# Patient Record
Sex: Male | Born: 1985 | Race: Black or African American | Hispanic: No | Marital: Single | Smoking: Current every day smoker
Health system: Southern US, Community
[De-identification: ages and names within clinical notes are randomized; demographics above are authoritative.]

## PROBLEM LIST (undated history)

## (undated) DIAGNOSIS — J45909 Unspecified asthma, uncomplicated: Secondary | ICD-10-CM

## (undated) DIAGNOSIS — M79A29 Nontraumatic compartment syndrome of unspecified lower extremity: Secondary | ICD-10-CM

## (undated) DIAGNOSIS — M1990 Unspecified osteoarthritis, unspecified site: Secondary | ICD-10-CM

## (undated) HISTORY — PX: OTHER SURGICAL HISTORY: SHX169

---

## 2006-04-19 ENCOUNTER — Emergency Department: Payer: Self-pay

## 2006-12-31 ENCOUNTER — Emergency Department: Payer: Self-pay | Admitting: Emergency Medicine

## 2007-01-01 ENCOUNTER — Ambulatory Visit: Payer: Self-pay | Admitting: Emergency Medicine

## 2007-01-16 ENCOUNTER — Emergency Department: Payer: Self-pay | Admitting: Emergency Medicine

## 2007-09-03 ENCOUNTER — Emergency Department: Payer: Self-pay | Admitting: Emergency Medicine

## 2007-09-17 ENCOUNTER — Emergency Department: Payer: Self-pay | Admitting: Emergency Medicine

## 2010-07-06 ENCOUNTER — Emergency Department: Payer: Self-pay | Admitting: Emergency Medicine

## 2010-12-23 ENCOUNTER — Emergency Department: Payer: Self-pay | Admitting: Internal Medicine

## 2016-11-17 ENCOUNTER — Other Ambulatory Visit: Payer: Self-pay

## 2016-11-17 ENCOUNTER — Emergency Department
Admission: EM | Admit: 2016-11-17 | Discharge: 2016-11-17 | Disposition: A | Discharge status: Home / Self Care | Payer: Medicare Other | Attending: Emergency Medicine | Admitting: Emergency Medicine

## 2016-11-17 ENCOUNTER — Encounter: Payer: Self-pay | Admitting: Emergency Medicine

## 2016-11-17 ENCOUNTER — Emergency Department: Payer: Medicare Other

## 2016-11-17 DIAGNOSIS — F17210 Nicotine dependence, cigarettes, uncomplicated: Secondary | ICD-10-CM | POA: Diagnosis not present

## 2016-11-17 DIAGNOSIS — Y929 Unspecified place or not applicable: Secondary | ICD-10-CM | POA: Insufficient documentation

## 2016-11-17 DIAGNOSIS — S6721XA Crushing injury of right hand, initial encounter: Secondary | ICD-10-CM | POA: Diagnosis not present

## 2016-11-17 DIAGNOSIS — S59911A Unspecified injury of right forearm, initial encounter: Secondary | ICD-10-CM | POA: Diagnosis present

## 2016-11-17 DIAGNOSIS — S5781XA Crushing injury of right forearm, initial encounter: Secondary | ICD-10-CM | POA: Diagnosis not present

## 2016-11-17 DIAGNOSIS — W228XXA Striking against or struck by other objects, initial encounter: Secondary | ICD-10-CM | POA: Diagnosis not present

## 2016-11-17 DIAGNOSIS — Y999 Unspecified external cause status: Secondary | ICD-10-CM | POA: Diagnosis not present

## 2016-11-17 DIAGNOSIS — Y9389 Activity, other specified: Secondary | ICD-10-CM | POA: Diagnosis not present

## 2016-11-17 HISTORY — DX: Unspecified osteoarthritis, unspecified site: M19.90

## 2016-11-17 MED ORDER — NAPROXEN 500 MG PO TABS: 500.0000 mg | ORAL_TABLET | Freq: Two times a day (BID) | ORAL | 0 refills | Status: DC

## 2016-11-17 MED ORDER — HYDROCODONE-ACETAMINOPHEN 5-325 MG PO TABS: 1.0000 | ORAL_TABLET | ORAL | 0 refills | Status: DC | PRN
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
## 2016-11-17 NOTE — Discharge Instructions (Signed)
Ice and elevation as needed for pain and swelling. Follow-up with the orthopedic Department at [facility] if any continued problems. You may also follow-up with [facility]. Take Norco one every 6 hours as needed for moderate to severe pain. Begin taking naproxen 500 mg twice a day with food.  Today you are given a work note for 2 days only. You will need  to follow-up with either an urgent care or doctor of your choice if continued days off as needed.
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
## 2016-11-17 NOTE — ED Notes (Signed)
Pt relayed to this nurse, "why yall not trying to do anything for my pain". "How is antibiotics going to help my pain?" . Informed provider of pain control issue.

## 2016-12-20 ENCOUNTER — Encounter (HOSPITAL_COMMUNITY): Payer: Self-pay

## 2016-12-20 ENCOUNTER — Other Ambulatory Visit: Payer: Self-pay

## 2016-12-20 ENCOUNTER — Emergency Department (HOSPITAL_COMMUNITY)
Admission: EM | Admit: 2016-12-20 | Discharge: 2016-12-20 | Disposition: A | Discharge status: Home / Self Care | Payer: Medicare Other | Attending: Emergency Medicine | Admitting: Emergency Medicine

## 2016-12-20 DIAGNOSIS — F17210 Nicotine dependence, cigarettes, uncomplicated: Secondary | ICD-10-CM | POA: Insufficient documentation

## 2016-12-20 DIAGNOSIS — M79601 Pain in right arm: Secondary | ICD-10-CM | POA: Insufficient documentation

## 2016-12-20 MED ORDER — NAPROXEN 500 MG PO TABS: 500.0000 mg | ORAL_TABLET | Freq: Two times a day (BID) | ORAL | 0 refills | Status: DC

## 2016-12-20 MED ORDER — ALBUTEROL SULFATE HFA 108 (90 BASE) MCG/ACT IN AERS: 1.0000 | INHALATION_SPRAY | Freq: Four times a day (QID) | RESPIRATORY_TRACT | 3 refills | Status: DC | PRN
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
## 2016-12-20 NOTE — Discharge Instructions (Signed)
Follow-up with orthopedist listed below for further duration. Take naproxen as needed for pain. Return to ED for worsening pain, additional injuries, loss of sensation in arm, color or temperature change of joints.

## 2016-12-25 ENCOUNTER — Emergency Department: Payer: Medicare Other

## 2016-12-25 ENCOUNTER — Emergency Department
Admission: EM | Admit: 2016-12-25 | Discharge: 2016-12-25 | Disposition: A | Discharge status: Home / Self Care | Payer: Medicare Other | Attending: Emergency Medicine | Admitting: Emergency Medicine

## 2016-12-25 ENCOUNTER — Other Ambulatory Visit: Payer: Self-pay

## 2016-12-25 DIAGNOSIS — Z79899 Other long term (current) drug therapy: Secondary | ICD-10-CM | POA: Diagnosis not present

## 2016-12-25 DIAGNOSIS — Y9389 Activity, other specified: Secondary | ICD-10-CM | POA: Insufficient documentation

## 2016-12-25 DIAGNOSIS — Y929 Unspecified place or not applicable: Secondary | ICD-10-CM | POA: Insufficient documentation

## 2016-12-25 DIAGNOSIS — Y998 Other external cause status: Secondary | ICD-10-CM | POA: Insufficient documentation

## 2016-12-25 DIAGNOSIS — S59911A Unspecified injury of right forearm, initial encounter: Secondary | ICD-10-CM | POA: Diagnosis present

## 2016-12-25 DIAGNOSIS — S52121A Displaced fracture of head of right radius, initial encounter for closed fracture: Secondary | ICD-10-CM | POA: Diagnosis not present

## 2016-12-25 DIAGNOSIS — W01198A Fall on same level from slipping, tripping and stumbling with subsequent striking against other object, initial encounter: Secondary | ICD-10-CM | POA: Diagnosis not present

## 2016-12-25 DIAGNOSIS — F17210 Nicotine dependence, cigarettes, uncomplicated: Secondary | ICD-10-CM | POA: Insufficient documentation

## 2016-12-25 MED ORDER — NAPROXEN 500 MG PO TABS: 500.0000 mg | ORAL_TABLET | Freq: Two times a day (BID) | ORAL | 0 refills | Status: DC

## 2016-12-25 MED ORDER — HYDROCODONE-ACETAMINOPHEN 5-325 MG PO TABS: 1.0000 | ORAL_TABLET | Freq: Four times a day (QID) | ORAL | 0 refills | Status: DC | PRN

## 2016-12-25 NOTE — ED Triage Notes (Signed)
Pt slipped and fell into snow today. C/o right arm pain since. Hx of right arm injury and nerve injury to that right arm.

## 2016-12-25 NOTE — Discharge Instructions (Signed)
Wear sling for support and protection.  Ice and elevate to decrease swelling. Norco every 6 hours prn pain.  Begin taking naproxen twice a day with food for inflammation. Call Dr. Odis LusterBowers for an appointment.

## 2016-12-25 NOTE — ED Provider Notes (Signed)
[facility] Emergency Department Provider Note  ____________________________________________   First MD Initiated Contact with Patient 12/25/16 1207     (approximate)  I have reviewed the triage vital signs and the nursing notes.   HISTORY  Chief Complaint Fall   HPI Alric QuanLennard Mach is a 31 y.o. male is here complaining of right forearm pain after falling in the snow today. Patient states he has a history of injury to his arm in the past requiring surgery. Patient denies any head injury or loss of consciousness during his fall. Patient has not taken any over-the-counter medication prior to his arrival.he rates his pain as a 10 out of 10.   Past Medical History:  Diagnosis Date  . Arthritis     There are no active problems to display for this patient.   Past Surgical History:  Procedure Laterality Date  . arm surgery      Prior to Admission medications   Medication Sig Start Date End Date Taking? Authorizing Provider  albuterol (PROVENTIL HFA;VENTOLIN HFA) 108 (90 Base) MCG/ACT inhaler Inhale 1-2 puffs into the lungs every 6 (six) hours as needed for wheezing or shortness of breath. 12/20/16   Khatri, Hina, PA-C  gabapentin (NEURONTIN) 400 MG capsule Take 800 mg by mouth at bedtime.    [provider]  HYDROcodone-acetaminophen (NORCO) 5-325 MG tablet Take 1 tablet by mouth every 6 (six) hours as needed for moderate pain. 12/25/16   Tommi RumpsSummers, Rhonda L, PA-C  naproxen (NAPROSYN) 500 MG tablet Take 1 tablet (500 mg total) by mouth 2 (two) times daily with a meal. 12/25/16   Tommi RumpsSummers, Rhonda L, PA-C    Allergies Banana; Grapeseed extract [nutritional supplements]; and Penicillins  No family history on file.  Social History Social History   Tobacco Use  . Smoking status: Current Every Day Smoker    Types: Cigarettes  . Smokeless tobacco: Never Used  Substance Use Topics  . Alcohol use: No    Frequency: Never  . Drug use: No     Review of Systems Constitutional: No fever/chills Cardiovascular: Denies chest pain. Respiratory: Denies shortness of breath. Gastrointestinal:  No nausea, no vomiting.  Musculoskeletal: positive for right forearm pain. Skin: Negative for rash. Neurological: positive for chronic paresthesias secondary to injury to right arm. ____________________________________________   PHYSICAL EXAM:  VITAL SIGNS: ED Triage Vitals  Enc Vitals Group     BP 12/25/16 1113 (!) 141/98     Pulse Rate 12/25/16 1113 94     Resp 12/25/16 1113 18     Temp 12/25/16 1113 98.7 F (37.1 C)     Temp Source 12/25/16 1113 Oral     SpO2 12/25/16 1113 100 %     Weight 12/25/16 1114 187 lb (84.8 kg)     Height 12/25/16 1114 5\' 6"  (1.676 m)     Head Circumference --      Peak Flow --      Pain Score 12/25/16 1113 10     Pain Loc --      Pain Edu? --      Excl. in GC? --    Constitutional: Alert and oriented. Well appearing and in no acute distress. Eyes: Conjunctivae are normal.  Head: Atraumatic. Nose: no trauma. Neck: No stridor.  No cervical tenderness on palpation posteriorly. Range of motion is without restriction. Cardiovascular: Normal rate, regular rhythm. Grossly normal heart sounds.  Good peripheral circulation. Respiratory: Normal respiratory effort.  No retractions. Lungs CTAB. Gastrointestinal: Soft and nontender.  No distention.  No CVA tenderness. Musculoskeletal: moderate tenderness on palpation of the right forearm diffusely. Patient has a large scar from previous injury and surgery in the volar aspect. No abrasions or soft tissue swelling is seen. Patient is able to pull his sweat shirt off without assistance and place it back on. There is no tenderness on palpation of the right shoulder both anteriorly and posteriorly. Range of motion of the shoulder is without crepitus. Patient guards against movement of the right elbow. Neurologic:  Normal speech and language. No gross focal neurologic  deficits are appreciated. No gait instability. Skin:  Skin is warm, dry and intact. No abrasions or ecchymosis noted. Scar tissue as noted above. Psychiatric: Mood and affect are normal. Speech and behavior are normal.  ____________________________________________   LABS (all labs ordered are listed, but only abnormal results are displayed)  Labs Reviewed - No data to display   RADIOLOGY  Dg Forearm Right  Result Date: 12/25/2016 CLINICAL DATA:  Slipped and fell into snow today. Right arm pain. Nerve injury. EXAM: RIGHT FOREARM - 2 VIEW COMPARISON:  None. FINDINGS: A mildly displaced fracture is present at the radial head. A joint effusion is present. No other focal soft tissue injury is present. The wrist is intact. IMPRESSION: Mildly displaced right radial head fracture. Electronically Signed   By: Marin Robertshristopher  Mattern M.D.   On: 12/25/2016 13:02    ____________________________________________   PROCEDURES  Procedure(s) performed: None  Procedures  Critical Care performed: No  ____________________________________________   INITIAL IMPRESSION / ASSESSMENT AND PLAN / ED COURSE Patient was made aware that he does have a minimally displaced fracture of the radial head. He was placed in a sling. Patient was given a prescription for naproxen 500 mg twice a day with food and Norco one every 6 hours as needed for moderate pain. He is to ice and elevate as needed for pain or swelling. He was given information about Dr. Odis LusterBowers and contact him as he is the orthopedist on call.  ____________________________________________   FINAL CLINICAL IMPRESSION(S) / ED DIAGNOSES  Final diagnoses:  Closed displaced fracture of head of right radius, initial encounter     ED Discharge Orders        Ordered    HYDROcodone-acetaminophen (NORCO) 5-325 MG tablet  Every 6 hours PRN     12/25/16 1323    naproxen (NAPROSYN) 500 MG tablet  2 times daily with meals     12/25/16 1323        Note:  This document was prepared using Dragon voice recognition software and may include unintentional dictation errors.    Tommi RumpsSummers, Rhonda L, PA-C 12/25/16 1348    Jene EveryKinner, Robert, MD 12/25/16 [id number]
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
## 2017-01-05 ENCOUNTER — Other Ambulatory Visit: Payer: Self-pay | Admitting: Emergency Medicine

## 2018-09-02 ENCOUNTER — Emergency Department: Payer: Medicare (Managed Care)

## 2018-09-02 ENCOUNTER — Encounter: Payer: Self-pay | Admitting: Emergency Medicine

## 2018-09-02 ENCOUNTER — Emergency Department
Admission: EM | Admit: 2018-09-02 | Discharge: 2018-09-02 | Disposition: A | Discharge status: Home / Self Care | Payer: Medicare (Managed Care) | Attending: Emergency Medicine | Admitting: Emergency Medicine

## 2018-09-02 ENCOUNTER — Other Ambulatory Visit: Payer: Self-pay

## 2018-09-02 DIAGNOSIS — Y998 Other external cause status: Secondary | ICD-10-CM | POA: Diagnosis not present

## 2018-09-02 DIAGNOSIS — S59911A Unspecified injury of right forearm, initial encounter: Secondary | ICD-10-CM | POA: Diagnosis present

## 2018-09-02 DIAGNOSIS — W231XXA Caught, crushed, jammed, or pinched between stationary objects, initial encounter: Secondary | ICD-10-CM | POA: Diagnosis not present

## 2018-09-02 DIAGNOSIS — Y9389 Activity, other specified: Secondary | ICD-10-CM | POA: Insufficient documentation

## 2018-09-02 DIAGNOSIS — F17210 Nicotine dependence, cigarettes, uncomplicated: Secondary | ICD-10-CM | POA: Diagnosis not present

## 2018-09-02 DIAGNOSIS — Y929 Unspecified place or not applicable: Secondary | ICD-10-CM | POA: Diagnosis not present

## 2018-09-02 DIAGNOSIS — S5011XA Contusion of right forearm, initial encounter: Secondary | ICD-10-CM | POA: Insufficient documentation

## 2018-09-02 MED ORDER — HYDROCODONE-ACETAMINOPHEN 5-325 MG PO TABS
1.0000 | ORAL_TABLET | Freq: Once | ORAL | Status: AC
  Administered 2018-09-02: 1 via ORAL
  Filled 2018-09-02: qty 1

## 2018-09-02 MED ORDER — MELOXICAM 15 MG PO TABS: 15.0000 mg | ORAL_TABLET | Freq: Every day | ORAL | 0 refills | Status: DC

## 2018-09-02 MED ORDER — TRAMADOL HCL 50 MG PO TABS: 50.0000 mg | ORAL_TABLET | Freq: Four times a day (QID) | ORAL | 0 refills | Status: DC | PRN

## 2018-09-02 NOTE — ED Triage Notes (Signed)
PT arrives with complaints of pain to right arm due to mechanical injury.

## 2018-09-02 NOTE — ED Notes (Signed)
esign pad not working pt unable to sign. Pt verbalized understanding of d/c and follow up instructions
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
## 2018-11-30 ENCOUNTER — Emergency Department: Payer: Medicare (Managed Care)

## 2018-11-30 ENCOUNTER — Other Ambulatory Visit: Payer: Self-pay

## 2018-11-30 ENCOUNTER — Encounter: Payer: Self-pay | Admitting: Emergency Medicine

## 2018-11-30 ENCOUNTER — Emergency Department
Admission: EM | Admit: 2018-11-30 | Discharge: 2018-11-30 | Disposition: A | Discharge status: Home / Self Care | Payer: Medicare (Managed Care) | Attending: Emergency Medicine | Admitting: Emergency Medicine

## 2018-11-30 DIAGNOSIS — Y999 Unspecified external cause status: Secondary | ICD-10-CM | POA: Insufficient documentation

## 2018-11-30 DIAGNOSIS — Y939 Activity, unspecified: Secondary | ICD-10-CM | POA: Diagnosis not present

## 2018-11-30 DIAGNOSIS — S199XXA Unspecified injury of neck, initial encounter: Secondary | ICD-10-CM | POA: Diagnosis present

## 2018-11-30 DIAGNOSIS — F17210 Nicotine dependence, cigarettes, uncomplicated: Secondary | ICD-10-CM | POA: Insufficient documentation

## 2018-11-30 DIAGNOSIS — S161XXA Strain of muscle, fascia and tendon at neck level, initial encounter: Secondary | ICD-10-CM | POA: Diagnosis not present

## 2018-11-30 DIAGNOSIS — Y92410 Unspecified street and highway as the place of occurrence of the external cause: Secondary | ICD-10-CM | POA: Insufficient documentation

## 2018-11-30 DIAGNOSIS — S4991XA Unspecified injury of right shoulder and upper arm, initial encounter: Secondary | ICD-10-CM | POA: Diagnosis not present

## 2018-11-30 HISTORY — DX: Nontraumatic compartment syndrome of unspecified lower extremity: M79.A29

## 2018-11-30 MED ORDER — TRAMADOL HCL 50 MG PO TABS
50.0000 mg | ORAL_TABLET | Freq: Once | ORAL | Status: AC
  Administered 2018-11-30: 50 mg via ORAL
  Filled 2018-11-30: qty 1

## 2018-11-30 MED ORDER — OXYCODONE-ACETAMINOPHEN 5-325 MG PO TABS
1.0000 | ORAL_TABLET | Freq: Once | ORAL | Status: AC
  Administered 2018-11-30: 1 via ORAL
  Filled 2018-11-30: qty 1

## 2018-11-30 MED ORDER — OXYCODONE-ACETAMINOPHEN 5-325 MG PO TABS: 1.0000 | ORAL_TABLET | ORAL | 0 refills | Status: DC | PRN

## 2018-11-30 MED ORDER — BACLOFEN 10 MG PO TABS: 10.0000 mg | ORAL_TABLET | Freq: Every day | ORAL | 1 refills | Status: DC
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
## 2018-11-30 NOTE — ED Notes (Signed)
Pt otf for imaging 

## 2018-11-30 NOTE — Discharge Instructions (Signed)
Follow-up with your regular orthopedic, please call him for an appointment.  Use medications as prescribed.  Apply ice to areas that hurt.  Return emergency department worsening.
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
## 2018-12-23 ENCOUNTER — Encounter (HOSPITAL_COMMUNITY): Payer: Self-pay | Admitting: Emergency Medicine

## 2018-12-23 ENCOUNTER — Encounter: Payer: Self-pay | Admitting: Emergency Medicine

## 2018-12-23 ENCOUNTER — Emergency Department: Payer: Medicare (Managed Care)

## 2018-12-23 ENCOUNTER — Emergency Department (HOSPITAL_COMMUNITY)
Admission: EM | Admit: 2018-12-23 | Discharge: 2018-12-23 | Disposition: A | Discharge status: Home / Self Care | Payer: Medicare (Managed Care) | Attending: Emergency Medicine | Admitting: Emergency Medicine

## 2018-12-23 ENCOUNTER — Other Ambulatory Visit: Payer: Self-pay

## 2018-12-23 ENCOUNTER — Emergency Department
Admission: EM | Admit: 2018-12-23 | Discharge: 2018-12-23 | Disposition: A | Discharge status: Home / Self Care | Payer: Medicare (Managed Care) | Attending: Emergency Medicine | Admitting: Emergency Medicine

## 2018-12-23 DIAGNOSIS — S39011A Strain of muscle, fascia and tendon of abdomen, initial encounter: Secondary | ICD-10-CM | POA: Diagnosis not present

## 2018-12-23 DIAGNOSIS — Z5321 Procedure and treatment not carried out due to patient leaving prior to being seen by health care provider: Secondary | ICD-10-CM | POA: Diagnosis not present

## 2018-12-23 DIAGNOSIS — R1032 Left lower quadrant pain: Secondary | ICD-10-CM | POA: Diagnosis not present

## 2018-12-23 DIAGNOSIS — Y929 Unspecified place or not applicable: Secondary | ICD-10-CM | POA: Diagnosis not present

## 2018-12-23 DIAGNOSIS — X58XXXA Exposure to other specified factors, initial encounter: Secondary | ICD-10-CM | POA: Diagnosis not present

## 2018-12-23 DIAGNOSIS — Z79899 Other long term (current) drug therapy: Secondary | ICD-10-CM | POA: Diagnosis not present

## 2018-12-23 DIAGNOSIS — Y998 Other external cause status: Secondary | ICD-10-CM | POA: Diagnosis not present

## 2018-12-23 DIAGNOSIS — T148XXA Other injury of unspecified body region, initial encounter: Secondary | ICD-10-CM

## 2018-12-23 DIAGNOSIS — Y939 Activity, unspecified: Secondary | ICD-10-CM | POA: Diagnosis not present

## 2018-12-23 DIAGNOSIS — R112 Nausea with vomiting, unspecified: Secondary | ICD-10-CM | POA: Diagnosis not present

## 2018-12-23 DIAGNOSIS — F17210 Nicotine dependence, cigarettes, uncomplicated: Secondary | ICD-10-CM | POA: Insufficient documentation

## 2018-12-23 DIAGNOSIS — S3991XA Unspecified injury of abdomen, initial encounter: Secondary | ICD-10-CM | POA: Diagnosis present

## 2018-12-23 DIAGNOSIS — K625 Hemorrhage of anus and rectum: Secondary | ICD-10-CM | POA: Insufficient documentation

## 2018-12-23 LAB — CBC
HCT: 47.6 % (ref 39.0–52.0)
Hemoglobin: 16.2 g/dL (ref 13.0–17.0)
MCH: 33.3 pg (ref 26.0–34.0)
MCHC: 34 g/dL (ref 30.0–36.0)
MCV: 97.9 fL (ref 80.0–100.0)
Platelets: 296 10*3/uL (ref 150–400)
RBC: 4.86 MIL/uL (ref 4.22–5.81)
RDW: 13.1 % (ref 11.5–15.5)
WBC: 9 10*3/uL (ref 4.0–10.5)
nRBC: 0 % (ref 0.0–0.2)

## 2018-12-23 LAB — COMPREHENSIVE METABOLIC PANEL
ALT: 33 U/L (ref 0–44)
AST: 34 U/L (ref 15–41)
Albumin: 3.6 g/dL (ref 3.5–5.0)
Alkaline Phosphatase: 50 U/L (ref 38–126)
Anion gap: 9 (ref 5–15)
BUN: 16 mg/dL (ref 6–20)
CO2: 27 mmol/L (ref 22–32)
Calcium: 9 mg/dL (ref 8.9–10.3)
Chloride: 105 mmol/L (ref 98–111)
Creatinine, Ser: 1.08 mg/dL (ref 0.61–1.24)
GFR calc Af Amer: >60 mL/min (ref >60)
GFR calc non Af Amer: >60 mL/min (ref >60)
Glucose, Bld: 88 mg/dL (ref 70–99)
Potassium: 3.8 mmol/L (ref 3.5–5.1)
Sodium: 141 mmol/L (ref 135–145)
Total Bilirubin: 0.7 mg/dL (ref 0.3–1.2)
Total Protein: 6 g/dL — ABNORMAL LOW (ref 6.5–8.1)

## 2018-12-23 LAB — LIPASE, BLOOD: Lipase: 38 U/L (ref 11–51)

## 2018-12-23 MED ORDER — ONDANSETRON 4 MG PO TBDP
4.0000 mg | ORAL_TABLET | Freq: Once | ORAL | Status: AC
  Administered 2018-12-23: 4 mg via ORAL
  Filled 2018-12-23: qty 1

## 2018-12-23 MED ORDER — ALBUTEROL SULFATE HFA 108 (90 BASE) MCG/ACT IN AERS: 2.0000 | INHALATION_SPRAY | Freq: Four times a day (QID) | RESPIRATORY_TRACT | 1 refills | Status: DC | PRN

## 2018-12-23 MED ORDER — HYDROCODONE-ACETAMINOPHEN 5-325 MG PO TABS: 1.0000 | ORAL_TABLET | Freq: Four times a day (QID) | ORAL | 0 refills | Status: AC | PRN

## 2018-12-23 MED ORDER — DOCUSATE SODIUM 100 MG PO CAPS: 100.0000 mg | ORAL_CAPSULE | Freq: Two times a day (BID) | ORAL | 0 refills | Status: AC

## 2018-12-23 MED ORDER — SODIUM CHLORIDE 0.9% FLUSH: 3.0000 mL | Freq: Once | INTRAVENOUS | Status: DC

## 2018-12-23 MED ORDER — HYDROCODONE-ACETAMINOPHEN 5-325 MG PO TABS
2.0000 | ORAL_TABLET | Freq: Once | ORAL | Status: AC
  Administered 2018-12-23: 2 via ORAL
  Filled 2018-12-23: qty 2

## 2018-12-23 NOTE — ED Notes (Signed)
Pt appears in obvious pain while ambulating and reports being unable to get comfortable while sitting. Pain in LLQ and left flank since a sneeze last night. Bright red blood also noted in stool. Pt reports there is more blood the more he wipes but pt denies a known hx of hemorrhoids. No NVD and no fevers. No changes in urination.

## 2018-12-23 NOTE — ED Notes (Signed)
No reply for room x5. Not seen in lobby at this time.

## 2018-12-23 NOTE — ED Notes (Signed)
MD at bedside to update patient

## 2018-12-23 NOTE — ED Notes (Signed)
Pt has returned to treatment room form CT with no changes in symptoms and NAD noted.

## 2018-12-23 NOTE — ED Notes (Signed)
MD at bedside. 

## 2018-12-23 NOTE — ED Notes (Signed)
Patient transported to CT 

## 2018-12-23 NOTE — ED Provider Notes (Signed)
[facility] Emergency Department Provider Note  Time seen: 2:55 PM  I have reviewed the triage vital signs and the nursing notes.   HISTORY  Chief Complaint Abdominal Pain and Flank Pain   HPI Miguel Key is a 33 y.o. male with a past medical history of arthritis, presents to the emergency department for left flank pain.  According to the patient he sneezed last night and developed significant severe left mid abdominal pain.  States ever since he has had pain to this area space with any type of movement such as sitting up or twisting.  He also noticed this morning some blood after having a hard bowel movement.  Patient states moderate sharp pain to left mid abdomen.  Also states he is out of his albuterol inhaler and is requesting a refill.   Past Medical History:  Diagnosis Date  . Arthritis   . Chronic compartment syndrome of lower extremity    of upper extremity not lower pt per    There are no active problems to display for this patient.   Past Surgical History:  Procedure Laterality Date  . arm surgery      Prior to Admission medications   Medication Sig Start Date End Date Taking? Authorizing Provider  albuterol (PROVENTIL HFA;VENTOLIN HFA) 108 (90 Base) MCG/ACT inhaler Inhale 1-2 puffs into the lungs every 6 (six) hours as needed for wheezing or shortness of breath. 12/20/16   Khatri, Hina, PA-C  baclofen (LIORESAL) 10 MG tablet Take 1 tablet (10 mg total) by mouth daily. 11/30/18 11/30/19  Fisher, Roselyn Bering, PA-C  gabapentin (NEURONTIN) 400 MG capsule Take 800 mg by mouth at bedtime.    [provider]  meloxicam (MOBIC) 15 MG tablet Take 1 tablet (15 mg total) by mouth daily. 09/02/18   Triplett, Rulon Eisenmenger B, FNP  oxyCODONE-acetaminophen (PERCOCET) 5-325 MG tablet Take 1 tablet by mouth every 4 (four) hours as needed for severe pain. 11/30/18 11/30/19  Fisher, Roselyn Bering, PA-C  traMADol (ULTRAM) 50 MG tablet Take 1 tablet (50 mg total) by mouth  every 6 (six) hours as needed. 09/02/18   Chinita Pester, FNP    Allergies  Allergen Reactions  . Banana Anaphylaxis  . Grapeseed Extract [Nutritional Supplements] Anaphylaxis  . Penicillins Hives    Has patient had a PCN reaction causing immediate rash, facial/tongue/throat swelling, SOB or lightheadedness with hypotension: Yes Has patient had a PCN reaction causing severe rash involving mucus membranes or skin necrosis: No Has patient had a PCN reaction that required hospitalization: No Has patient had a PCN reaction occurring within the last 10 years: Yes If all of the above answers are "NO", then may proceed with Cephalosporin use.     No family history on file.  Social History Social History   Tobacco Use  . Smoking status: Current Every Day Smoker    Types: Cigarettes  . Smokeless tobacco: Never Used  Substance Use Topics  . Alcohol use: No    Frequency: Never  . Drug use: No    Review of Systems Constitutional: Negative for fever. Cardiovascular: Negative for chest pain. Respiratory: Negative for shortness of breath. Gastrointestinal: Left flank pain.  Negative for nausea vomiting or diarrhea.  Hard bowel movement this morning with some blood. Genitourinary: Negative for urinary compaints Musculoskeletal: Negative for musculoskeletal complaints Neurological: Negative for headache All other ROS negative  ____________________________________________   PHYSICAL EXAM:  VITAL SIGNS: ED Triage Vitals  Enc Vitals Group     BP  12/23/18 1418 (!) 143/86     Pulse Rate 12/23/18 1418 81     Resp 12/23/18 1418 18     Temp 12/23/18 1418 99.1 F (37.3 C)     Temp Source 12/23/18 1418 Oral     SpO2 12/23/18 1418 100 %     Weight 12/23/18 1419 203 lb (92.1 kg)     Height 12/23/18 1419 5\' 6"  (1.676 m)     Head Circumference --      Peak Flow --      Pain Score 12/23/18 1419 7     Pain Loc --      Pain Edu? --      Excl. in [place]? --    Constitutional: Alert and  oriented. Well appearing and in no distress. Eyes: Normal exam ENT      Head: Normocephalic and atraumatic.      Mouth/Throat: Mucous membranes are moist. Cardiovascular: Normal rate, regular rhythm.  Respiratory: Normal respiratory effort without tachypnea nor retractions. Breath sounds are clear  Gastrointestinal: Soft, mild to moderate left mid abdominal tenderness to palpation without rebound guarding or distention. Musculoskeletal: Nontender with normal range of motion in all extremities.  Neurologic:  Normal speech and language. No gross focal neurologic deficits Skin:  Skin is warm, dry and intact.  Psychiatric: Mood and affect are normal.   ____________________________________________   RADIOLOGY  CT negative  ____________________________________________   INITIAL IMPRESSION / ASSESSMENT AND PLAN / ED COURSE  Pertinent labs & imaging results that were available during my care of the patient were reviewed by me and considered in my medical decision making (see chart for details).   Patient presents emergency department for left sided abdominal pain/flank pain since sneezing last night.  Differential would include external oblique spasm or strain, intra-abdominal injury.  As far as the mild amount of blood after having a hard bowel movement exam most consistent with very small anal fissure, no hemorrhoids noted on exam.  We will obtain CT imaging as a precaution and dose pain medication.  Patient agreeable.  CT negative.  Again pain very consistent with musculoskeletal pain.  We will discharge patient home with a short course of pain medication, Colace and we will refill his albuterol as requested.  Miguel Key was evaluated in Emergency Department on 12/23/2018 for the symptoms described in the history of present illness. He was evaluated in the context of the global COVID-19 pandemic, which necessitated consideration that the patient might be at risk for infection with the  SARS-CoV-2 virus that causes COVID-19. Institutional protocols and algorithms that pertain to the evaluation of patients at risk for COVID-19 are in a state of rapid change based on information released by regulatory bodies including the CDC and federal and state organizations. These policies and algorithms were followed during the patient's care in the ED.  ____________________________________________   FINAL CLINICAL IMPRESSION(S) / ED DIAGNOSES  Abdominal pain   Harvest Dark, MD 12/23/18 1534

## 2018-12-23 NOTE — ED Triage Notes (Signed)
Pt here with c/o sneezing last night and felt a "pop" in LLQ and left flank area. States pain continues today, worsens with movement, denies N,V,D, appears in NAD.

## 2018-12-23 NOTE — ED Triage Notes (Signed)
Pt states he sneezed last night and felt something "pop" in L abd/L flank.  C/o pain to L flank, L abd, nausea, and vomiting.  Pain is worse with movement.

## 2019-01-19 ENCOUNTER — Emergency Department
Admission: EM | Admit: 2019-01-19 | Discharge: 2019-01-20 | Disposition: A | Discharge status: Home / Self Care | Payer: Medicare (Managed Care) | Attending: Student | Admitting: Student

## 2019-01-19 ENCOUNTER — Other Ambulatory Visit: Payer: Self-pay

## 2019-01-19 ENCOUNTER — Emergency Department: Payer: Medicare (Managed Care)

## 2019-01-19 DIAGNOSIS — Z046 Encounter for general psychiatric examination, requested by authority: Secondary | ICD-10-CM | POA: Diagnosis present

## 2019-01-19 DIAGNOSIS — Z20822 Contact with and (suspected) exposure to covid-19: Secondary | ICD-10-CM | POA: Diagnosis not present

## 2019-01-19 DIAGNOSIS — F17210 Nicotine dependence, cigarettes, uncomplicated: Secondary | ICD-10-CM | POA: Diagnosis not present

## 2019-01-19 DIAGNOSIS — F209 Schizophrenia, unspecified: Secondary | ICD-10-CM | POA: Diagnosis present

## 2019-01-19 DIAGNOSIS — R410 Disorientation, unspecified: Secondary | ICD-10-CM | POA: Diagnosis not present

## 2019-01-19 DIAGNOSIS — F419 Anxiety disorder, unspecified: Secondary | ICD-10-CM | POA: Diagnosis present

## 2019-01-19 DIAGNOSIS — Z9119 Patient's noncompliance with other medical treatment and regimen: Secondary | ICD-10-CM | POA: Insufficient documentation

## 2019-01-19 DIAGNOSIS — F32A Depression, unspecified: Secondary | ICD-10-CM | POA: Diagnosis present

## 2019-01-19 DIAGNOSIS — F329 Major depressive disorder, single episode, unspecified: Secondary | ICD-10-CM | POA: Diagnosis not present

## 2019-01-19 LAB — URINE DRUG SCREEN, QUALITATIVE (ARMC ONLY)
Amphetamines, Ur Screen: NOT DETECTED
Barbiturates, Ur Screen: NOT DETECTED
Benzodiazepine, Ur Scrn: NOT DETECTED
Cannabinoid 50 Ng, Ur ~~LOC~~: POSITIVE — AB
Cocaine Metabolite,Ur ~~LOC~~: NOT DETECTED
MDMA (Ecstasy)Ur Screen: NOT DETECTED
Methadone Scn, Ur: NOT DETECTED
Opiate, Ur Screen: NOT DETECTED
Phencyclidine (PCP) Ur S: POSITIVE — AB
Tricyclic, Ur Screen: NOT DETECTED

## 2019-01-19 LAB — CBC
HCT: 46.4 % (ref 39.0–52.0)
Hemoglobin: 15.9 g/dL (ref 13.0–17.0)
MCH: 33.1 pg (ref 26.0–34.0)
MCHC: 34.3 g/dL (ref 30.0–36.0)
MCV: 96.5 fL (ref 80.0–100.0)
Platelets: 291 10*3/uL (ref 150–400)
RBC: 4.81 MIL/uL (ref 4.22–5.81)
RDW: 12.1 % (ref 11.5–15.5)
WBC: 13.2 10*3/uL — ABNORMAL HIGH (ref 4.0–10.5)
nRBC: 0 % (ref 0.0–0.2)

## 2019-01-19 LAB — URINALYSIS, COMPLETE (UACMP) WITH MICROSCOPIC
Bacteria, UA: NONE SEEN
Bilirubin Urine: NEGATIVE
Glucose, UA: NEGATIVE mg/dL
Hgb urine dipstick: NEGATIVE
Ketones, ur: NEGATIVE mg/dL
Leukocytes,Ua: NEGATIVE
Nitrite: NEGATIVE
Protein, ur: NEGATIVE mg/dL
Specific Gravity, Urine: 1.011 (ref 1.005–1.030)
Squamous Epithelial / HPF: NONE SEEN (ref 0–5)
pH: 6 (ref 5.0–8.0)

## 2019-01-19 LAB — COMPREHENSIVE METABOLIC PANEL
ALT: 27 U/L (ref 0–44)
AST: 33 U/L (ref 15–41)
Albumin: 4.4 g/dL (ref 3.5–5.0)
Alkaline Phosphatase: 64 U/L (ref 38–126)
Anion gap: 10 (ref 5–15)
BUN: 14 mg/dL (ref 6–20)
CO2: 28 mmol/L (ref 22–32)
Calcium: 9.4 mg/dL (ref 8.9–10.3)
Chloride: 99 mmol/L (ref 98–111)
Creatinine, Ser: 1.08 mg/dL (ref 0.61–1.24)
GFR calc Af Amer: >60 mL/min (ref >60)
GFR calc non Af Amer: >60 mL/min (ref >60)
Glucose, Bld: 88 mg/dL (ref 70–99)
Potassium: 3.8 mmol/L (ref 3.5–5.1)
Sodium: 137 mmol/L (ref 135–145)
Total Bilirubin: 0.5 mg/dL (ref 0.3–1.2)
Total Protein: 7.1 g/dL (ref 6.5–8.1)

## 2019-01-19 LAB — ACETAMINOPHEN LEVEL: Acetaminophen (Tylenol), Serum: <10 ug/mL — ABNORMAL LOW (ref 10–30)

## 2019-01-19 LAB — ETHANOL: Alcohol, Ethyl (B): <10 mg/dL (ref <10)

## 2019-01-19 LAB — SALICYLATE LEVEL: Salicylate Lvl: <7 mg/dL — ABNORMAL LOW (ref 7.0–30.0)

## 2019-01-19 NOTE — ED Notes (Signed)
Hourly rounding reveals patient in room. No complaints, stable, in no acute distress. Q15 minute rounds and monitoring via Rover and Officer to continue.   
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
## 2019-01-19 NOTE — ED Notes (Signed)
Pt. Transferred from Triage to room 19H after dressing out and screening for contraband. Report to include Situation, Background, Assessment and Recommendations from Raquel RN. Pt. Oriented to Quad including Q15 minute rounds as well as Rover and Officer for their protection. Patient is alert and oriented, warm and dry in no acute distress. Patient denies SI, HI, and AVH. Pt. Encouraged to let me know if needs arise.   

## 2019-01-19 NOTE — ED Notes (Signed)
Unable to do protocols or dress out in triage, pt agitated and confused. Pt checked for metal by security and walked back to [facility]. Hewan RN made aware.
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
## 2019-01-20 DIAGNOSIS — F32A Depression, unspecified: Secondary | ICD-10-CM | POA: Diagnosis present

## 2019-01-20 DIAGNOSIS — F329 Major depressive disorder, single episode, unspecified: Secondary | ICD-10-CM | POA: Diagnosis present

## 2019-01-20 DIAGNOSIS — F419 Anxiety disorder, unspecified: Secondary | ICD-10-CM | POA: Diagnosis present

## 2019-01-20 DIAGNOSIS — F209 Schizophrenia, unspecified: Secondary | ICD-10-CM | POA: Diagnosis present

## 2019-01-20 LAB — RESPIRATORY PANEL BY RT PCR (FLU A&B, COVID)
Influenza A by PCR: NEGATIVE
Influenza B by PCR: NEGATIVE
SARS Coronavirus 2 by RT PCR: NEGATIVE

## 2019-01-20 MED ORDER — HYDROXYZINE HCL 25 MG PO TABS
50.0000 mg | ORAL_TABLET | Freq: Once | ORAL | Status: AC
  Administered 2019-01-20: 02:00:00 50 mg via ORAL
  Filled 2019-01-20: qty 2

## 2019-01-20 NOTE — ED Notes (Signed)
Hourly rounding reveals patient in room. No complaints, stable, in no acute distress. Q15 minute rounds and monitoring via Rover and Officer to continue.   

## 2019-01-20 NOTE — ED Provider Notes (Signed)
-----------------------------------------   5:21 AM on 01/20/2019 -----------------------------------------   Blood pressure (!) 139/105, pulse 92, temperature 99.1 F (37.3 C), temperature source Oral, resp. rate 20, weight 93 kg, SpO2 100 %.  The patient is sleeping at this time.  There have been no acute events since the last update.  Psychiatry and TTS evaluated patient overnight with plan to admit patient to [facility].   Irean Hong, MD 01/20/19 Jeralyn Bennett

## 2019-01-20 NOTE — ED Notes (Signed)
VOL/PENDING PSYCH ADMIT. 
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
## 2019-08-13 ENCOUNTER — Encounter: Payer: Self-pay | Admitting: Emergency Medicine

## 2019-08-13 ENCOUNTER — Emergency Department: Payer: Medicare Other

## 2019-08-13 ENCOUNTER — Other Ambulatory Visit: Payer: Self-pay

## 2019-08-13 ENCOUNTER — Emergency Department
Admission: EM | Admit: 2019-08-13 | Discharge: 2019-08-13 | Disposition: A | Discharge status: Home / Self Care | Payer: Medicare Other | Attending: Emergency Medicine | Admitting: Emergency Medicine

## 2019-08-13 ENCOUNTER — Emergency Department
Admission: EM | Admit: 2019-08-13 | Discharge: 2019-08-13 | Disposition: A | Discharge status: Home / Self Care | Payer: Medicare Other | Source: Home / Self Care | Attending: Emergency Medicine | Admitting: Emergency Medicine

## 2019-08-13 DIAGNOSIS — F17210 Nicotine dependence, cigarettes, uncomplicated: Secondary | ICD-10-CM | POA: Insufficient documentation

## 2019-08-13 DIAGNOSIS — J441 Chronic obstructive pulmonary disease with (acute) exacerbation: Secondary | ICD-10-CM | POA: Diagnosis present

## 2019-08-13 DIAGNOSIS — Z5321 Procedure and treatment not carried out due to patient leaving prior to being seen by health care provider: Secondary | ICD-10-CM | POA: Insufficient documentation

## 2019-08-13 DIAGNOSIS — Z7951 Long term (current) use of inhaled steroids: Secondary | ICD-10-CM | POA: Insufficient documentation

## 2019-08-13 DIAGNOSIS — R519 Headache, unspecified: Secondary | ICD-10-CM | POA: Insufficient documentation

## 2019-08-13 DIAGNOSIS — R0602 Shortness of breath: Secondary | ICD-10-CM | POA: Insufficient documentation

## 2019-08-13 DIAGNOSIS — J4541 Moderate persistent asthma with (acute) exacerbation: Secondary | ICD-10-CM

## 2019-08-13 HISTORY — DX: Unspecified asthma, uncomplicated: J45.909

## 2019-08-13 LAB — BASIC METABOLIC PANEL
Anion gap: 10 (ref 5–15)
BUN: 21 mg/dL — ABNORMAL HIGH (ref 6–20)
CO2: 25 mmol/L (ref 22–32)
Calcium: 9.5 mg/dL (ref 8.9–10.3)
Chloride: 106 mmol/L (ref 98–111)
Creatinine, Ser: 1.02 mg/dL (ref 0.61–1.24)
GFR calc Af Amer: >60 mL/min (ref >60)
GFR calc non Af Amer: >60 mL/min (ref >60)
Glucose, Bld: 91 mg/dL (ref 70–99)
Potassium: 3.6 mmol/L (ref 3.5–5.1)
Sodium: 141 mmol/L (ref 135–145)

## 2019-08-13 LAB — CBC WITH DIFFERENTIAL/PLATELET
Abs Immature Granulocytes: 0.06 10*3/uL (ref 0.00–0.07)
Basophils Absolute: 0.1 10*3/uL (ref 0.0–0.1)
Basophils Relative: 0 %
Eosinophils Absolute: 0.4 10*3/uL (ref 0.0–0.5)
Eosinophils Relative: 3 %
HCT: 47.3 % (ref 39.0–52.0)
Hemoglobin: 16.2 g/dL (ref 13.0–17.0)
Immature Granulocytes: 0 %
Lymphocytes Relative: 6 %
Lymphs Abs: 1 10*3/uL (ref 0.7–4.0)
MCH: 33.4 pg (ref 26.0–34.0)
MCHC: 34.2 g/dL (ref 30.0–36.0)
MCV: 97.5 fL (ref 80.0–100.0)
Monocytes Absolute: 1.2 10*3/uL — ABNORMAL HIGH (ref 0.1–1.0)
Monocytes Relative: 8 %
Neutro Abs: 12.9 10*3/uL — ABNORMAL HIGH (ref 1.7–7.7)
Neutrophils Relative %: 83 %
Platelets: 262 10*3/uL (ref 150–400)
RBC: 4.85 MIL/uL (ref 4.22–5.81)
RDW: 13.2 % (ref 11.5–15.5)
WBC: 15.6 10*3/uL — ABNORMAL HIGH (ref 4.0–10.5)
nRBC: 0 % (ref 0.0–0.2)

## 2019-08-13 MED ORDER — ALBUTEROL SULFATE HFA 108 (90 BASE) MCG/ACT IN AERS: 2.0000 | INHALATION_SPRAY | Freq: Four times a day (QID) | RESPIRATORY_TRACT | 2 refills | Status: AC | PRN

## 2019-08-13 MED ORDER — NAPROXEN 500 MG PO TABS
500.0000 mg | ORAL_TABLET | Freq: Two times a day (BID) | ORAL | 0 refills | Status: AC
Start: 2019-08-13 — End: ?

## 2019-08-13 MED ORDER — PREDNISONE 10 MG PO TABS: 50.0000 mg | ORAL_TABLET | Freq: Every day | ORAL | 0 refills | Status: AC

## 2019-08-13 MED ORDER — IPRATROPIUM-ALBUTEROL 0.5-2.5 (3) MG/3ML IN SOLN
3.0000 mL | Freq: Once | RESPIRATORY_TRACT | Status: AC
  Administered 2019-08-13: 3 mL via RESPIRATORY_TRACT
  Filled 2019-08-13: qty 3

## 2019-08-13 MED ORDER — FLUTICASONE PROPIONATE HFA 220 MCG/ACT IN AERO: 2.0000 | INHALATION_SPRAY | Freq: Two times a day (BID) | RESPIRATORY_TRACT | 2 refills | Status: AC

## 2019-08-13 MED ORDER — ALBUTEROL SULFATE HFA 108 (90 BASE) MCG/ACT IN AERS
2.0000 | INHALATION_SPRAY | Freq: Four times a day (QID) | RESPIRATORY_TRACT | 2 refills | Status: DC | PRN
Start: 2019-08-13 — End: 2019-08-13

## 2019-08-13 MED ORDER — ALBUTEROL SULFATE (2.5 MG/3ML) 0.083% IN NEBU
2.5000 mg | INHALATION_SOLUTION | Freq: Once | RESPIRATORY_TRACT | Status: AC
  Administered 2019-08-13: 2.5 mg via RESPIRATORY_TRACT
  Filled 2019-08-13: qty 3

## 2019-08-13 NOTE — ED Triage Notes (Signed)
Pt left earlier after getting triaged. Now returned to be seen. Having asthma exacerbation, does not have meds at home. No known fever.  Labs done just prior today, will re-order xray.

## 2019-08-13 NOTE — ED Notes (Signed)
See triage note  Presents with some diff breathing and wheezing  States he is here helping his mother  Did not bring his inhaler  Low grade temp on arrival

## 2019-08-13 NOTE — ED Triage Notes (Signed)
First nurse note- here for asthma exacerbation.  Pt mildly labored at check in, sats 98% RA.  Does not have inhaler or nebulizer; from out of town and doctor will not send meds here.

## 2019-08-13 NOTE — ED Notes (Signed)
Pt informed front desk staff that he is leaving and going to another hospital.

## 2019-08-13 NOTE — ED Triage Notes (Signed)
Pt to ED via POV c/o shortness of breath. Pt states that this has been going on x 1 week. Pt reports that 3 days ago his lost his taste. Pt also has been having body aches and headache. Pt states that he has not checked his temperature, Pt has been sweating a lot. Pt states that he has had cough as well. Pt states that he has used 4 inhalers in the last 14 days. Pt is in NAD.

## 2019-08-13 NOTE — Discharge Instructions (Signed)
Please follow up with pulmonology for your persistent asthma and frequent use of albuterol.  Return to the ER for symptoms that change or worsen if unable to schedule an appointment with PCP or pulmonology.

## 2019-08-13 NOTE — ED Provider Notes (Signed)
[facility] Emergency Department Provider Note  ____________________________________________  Time seen: Approximately 3:07 PM  I have reviewed the triage vital signs and the nursing notes.   HISTORY  Chief Complaint Asthma   HPI Miguel Key is a 34 y.o. male with a history of asthma who presents to the emergency department for treatment and evaluation of asthma. He has been using his albuterol daily due to the heat, but has been out for a week. He is also out of his maintenance meds. Cough and wheezing has gotten worse. No fever. No known sick contacts.    Past Medical History:  Diagnosis Date   Arthritis    Asthma    Chronic compartment syndrome of lower extremity    of upper extremity not lower pt per    Patient Active Problem List   Diagnosis Date Noted   Anxiety disorder 01/20/2019   Depression 01/20/2019   Schizophrenia (HCC) 01/20/2019    Past Surgical History:  Procedure Laterality Date   arm surgery      Prior to Admission medications   Medication Sig Start Date End Date Taking? Authorizing Provider  albuterol (VENTOLIN HFA) 108 (90 Base) MCG/ACT inhaler Inhale 2 puffs into the lungs every 6 (six) hours as needed for wheezing or shortness of breath. 08/13/19   Avaleigh Decuir B, FNP  docusate sodium (COLACE) 100 MG capsule Take 1 capsule (100 mg total) by mouth 2 (two) times daily. 12/23/18 12/23/19  Minna Antis, MD  fluticasone (FLOVENT HFA) 220 MCG/ACT inhaler Inhale 2 puffs into the lungs 2 (two) times daily. 08/13/19 08/12/20  Calleen Alvis, Rulon Eisenmenger B, FNP  naproxen (NAPROSYN) 500 MG tablet Take 1 tablet (500 mg total) by mouth 2 (two) times daily with a meal. 08/13/19   Quentez Lober B, FNP  predniSONE (DELTASONE) 10 MG tablet Take 5 tablets (50 mg total) by mouth daily. 08/13/19   Dayvon Dax, Rulon Eisenmenger B, FNP    Allergies Banana, Grapeseed extract [nutritional supplements], and Penicillins  History reviewed. No pertinent family  history.  Social History Social History   Tobacco Use   Smoking status: Current Every Day Smoker    Types: Cigarettes   Smokeless tobacco: Never Used  Substance Use Topics   Alcohol use: No   Drug use: No    Review of Systems Constitutional: Negative for fever/chills. Normal appetite. ENT: No sore throat. Cardiovascular: Denies chest pain. Respiratory: Positive for shortness of breath. Positive  for cough. Positive for wheezing.  Gastrointestinal: Negative nausea,  no vomiting.  no diarrhea.  Musculoskeletal: Negative for body aches Skin: Negative for rash. Neurological: Negative for headaches ____________________________________________   PHYSICAL EXAM:  VITAL SIGNS: ED Triage Vitals  Enc Vitals Group     BP 08/13/19 1454 (!) 139/95     Pulse Rate 08/13/19 1454 89     Resp 08/13/19 1454 (!) 24     Temp 08/13/19 1454 99.3 F (37.4 C)     Temp Source 08/13/19 1454 Oral     SpO2 08/13/19 1454 100 %     Weight 08/13/19 1443 (!) 210 lb (95.3 kg)     Height 08/13/19 1443 5\' 6"  (1.676 m)     Head Circumference --      Peak Flow --      Pain Score 08/13/19 1443 0     Pain Loc --      Pain Edu? --      Excl. in GC? --     Constitutional: Alert and oriented. Well appearing and  in no acute distress. Eyes: Conjunctivae are normal. Ears: TM normal Nose: No sinus congestion noted; no rhinnorhea. Mouth/Throat: Mucous membranes are moist.  Oropharynx normal. Tonsils normal. Uvula midline. Neck: No stridor.  Lymphatic: No cervical lymphadenopathy. Cardiovascular: Normal rate, regular rhythm. Good peripheral circulation. Respiratory: Respirations are even and unlabored.  No retractions. Wheezing throughout, decreased air movement. Gastrointestinal: Soft and nontender.  Musculoskeletal: FROM x 4 extremities.  Neurologic:  Normal speech and language. Skin:  Skin is warm, dry and intact. No rash noted. Psychiatric: Mood and affect are normal. Speech and behavior are  normal.  ____________________________________________   LABS (all labs ordered are listed, but only abnormal results are displayed)  Labs Reviewed - No data to display ____________________________________________  EKG  Not indicated. ____________________________________________  RADIOLOGY  Chest x-ray with evidence of bronchitis, no evidence of consolidation or infiltration. ____________________________________________   PROCEDURES  Procedure(s) performed: None  Critical Care performed: No ____________________________________________   INITIAL IMPRESSION / ASSESSMENT AND PLAN / ED COURSE  34 y.o. male presents to the emergency department for treatment and evaluation of asthma exacerbation.  See HPI for further details.  Plan will be to give him a DuoNeb treatment and reassess.  Some improvement after DuoNeb.  Better air movement and decreased wheezing.  Plan will be to give him an albuterol treatment.  He will then likely be ready for discharge.  Improved after the albuterol.  Plan will be to discharge him home with refills of his albuterol inhaler and Flovent.  He will also be placed on a 5-day burst dose of prednisone.  Patient states that he has had good improvement with this treatment.  He was encouraged to follow-up with primary care or pulmonology.  He is to return to emergency department for symptoms of change or worsen if he is unable to schedule appointment.    Medications  ipratropium-albuterol (DUONEB) 0.5-2.5 (3) MG/3ML nebulizer solution 3 mL (3 mLs Nebulization Given 08/13/19 1509)  albuterol (PROVENTIL) (2.5 MG/3ML) 0.083% nebulizer solution 2.5 mg (2.5 mg Nebulization Given 08/13/19 1553)    ED Discharge Orders         Ordered    albuterol (VENTOLIN HFA) 108 (90 Base) MCG/ACT inhaler  Every 6 hours PRN,   Status:  Discontinued     Reprint     08/13/19 1710    naproxen (NAPROSYN) 500 MG tablet  2 times daily with meals     Discontinue  Reprint     08/13/19  1710    fluticasone (FLOVENT HFA) 220 MCG/ACT inhaler  2 times daily     Discontinue  Reprint     08/13/19 1710    predniSONE (DELTASONE) 10 MG tablet  Daily     Discontinue  Reprint     08/13/19 1710    albuterol (VENTOLIN HFA) 108 (90 Base) MCG/ACT inhaler  Every 6 hours PRN     Discontinue  Reprint     08/13/19 1830           Pertinent labs & imaging results that were available during my care of the patient were reviewed by me and considered in my medical decision making (see chart for details).    If controlled substance prescribed during this visit, 12 month history viewed on the [facility] prior to issuing an initial prescription for Schedule II or III opiod. ____________________________________________   FINAL CLINICAL IMPRESSION(S) / ED DIAGNOSES  Final diagnoses:  Moderate persistent asthma with exacerbation    Note:  This document was prepared using Dragon  voice recognition software and may include unintentional dictation errors.    Chinita Pester, FNP 08/13/19 2059    Phineas Semen, MD 08/13/19 2209
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
